# Patient Record
Sex: Female | Born: 1955 | Race: White | Hispanic: No | Marital: Single | Smoking: Former smoker
Health system: Southern US, Community
[De-identification: ages and names within clinical notes are randomized; demographics above are authoritative.]

## PROBLEM LIST (undated history)

## (undated) DIAGNOSIS — D649 Anemia, unspecified: Secondary | ICD-10-CM

## (undated) DIAGNOSIS — C801 Malignant (primary) neoplasm, unspecified: Secondary | ICD-10-CM

## (undated) HISTORY — PX: TUBAL LIGATION: SHX77

## (undated) HISTORY — PX: COLON SURGERY: SHX602

## (undated) HISTORY — PX: BREAST SURGERY: SHX581

## (undated) HISTORY — PX: OTHER SURGICAL HISTORY: SHX169

---

## 1997-05-04 ENCOUNTER — Ambulatory Visit (HOSPITAL_COMMUNITY): Admission: RE | Admit: 1997-05-04 | Discharge: 1997-05-04 | Payer: Self-pay

## 1997-05-06 ENCOUNTER — Ambulatory Visit (HOSPITAL_COMMUNITY): Admission: RE | Admit: 1997-05-06 | Discharge: 1997-05-06 | Payer: Self-pay

## 1997-11-08 ENCOUNTER — Ambulatory Visit (HOSPITAL_COMMUNITY): Admission: RE | Admit: 1997-11-08 | Discharge: 1997-11-08 | Payer: Self-pay

## 2002-02-19 HISTORY — PX: AUGMENTATION MAMMAPLASTY: SUR837

## 2002-10-28 ENCOUNTER — Ambulatory Visit (HOSPITAL_COMMUNITY): Admission: RE | Admit: 2002-10-28 | Discharge: 2002-10-28 | Payer: Self-pay | Admitting: Internal Medicine

## 2002-10-28 ENCOUNTER — Encounter: Payer: Self-pay | Admitting: Internal Medicine

## 2003-04-15 ENCOUNTER — Other Ambulatory Visit: Admission: RE | Admit: 2003-04-15 | Discharge: 2003-04-15 | Payer: Self-pay | Admitting: Internal Medicine

## 2007-04-29 DIAGNOSIS — D649 Anemia, unspecified: Secondary | ICD-10-CM | POA: Insufficient documentation

## 2007-04-29 DIAGNOSIS — N809 Endometriosis, unspecified: Secondary | ICD-10-CM | POA: Insufficient documentation

## 2008-04-20 ENCOUNTER — Encounter: Admission: RE | Admit: 2008-04-20 | Discharge: 2008-04-20 | Payer: Self-pay | Admitting: Obstetrics and Gynecology

## 2008-04-23 ENCOUNTER — Encounter: Admission: RE | Admit: 2008-04-23 | Discharge: 2008-04-23 | Payer: Self-pay | Admitting: Obstetrics and Gynecology

## 2009-01-24 ENCOUNTER — Inpatient Hospital Stay (HOSPITAL_COMMUNITY): Admission: AD | Admit: 2009-01-24 | Discharge: 2009-01-25 | Payer: Self-pay | Admitting: Gastroenterology

## 2009-01-25 ENCOUNTER — Inpatient Hospital Stay (HOSPITAL_COMMUNITY): Admission: AD | Admit: 2009-01-25 | Discharge: 2009-02-02 | Payer: Self-pay

## 2009-01-26 ENCOUNTER — Encounter (INDEPENDENT_AMBULATORY_CARE_PROVIDER_SITE_OTHER): Payer: Self-pay

## 2010-05-23 LAB — COMPREHENSIVE METABOLIC PANEL
Albumin: 3.5 g/dL (ref 3.5–5.2)
BUN: 7 mg/dL (ref 6–23)
Chloride: 108 mEq/L (ref 96–112)
Creatinine, Ser: 0.59 mg/dL (ref 0.4–1.2)
Total Bilirubin: 0.7 mg/dL (ref 0.3–1.2)
Total Protein: 5.6 g/dL — ABNORMAL LOW (ref 6.0–8.3)

## 2010-05-23 LAB — CBC
HCT: 25.2 % — ABNORMAL LOW (ref 36.0–46.0)
HCT: 27 % — ABNORMAL LOW (ref 36.0–46.0)
HCT: 31 % — ABNORMAL LOW (ref 36.0–46.0)
HCT: 40.3 % (ref 36.0–46.0)
Hemoglobin: 10.8 g/dL — ABNORMAL LOW (ref 12.0–15.0)
Hemoglobin: 8.8 g/dL — ABNORMAL LOW (ref 12.0–15.0)
MCHC: 34.6 g/dL (ref 30.0–36.0)
MCHC: 34.8 g/dL (ref 30.0–36.0)
MCV: 98.3 fL (ref 78.0–100.0)
MCV: 98.5 fL (ref 78.0–100.0)
MCV: 99.4 fL (ref 78.0–100.0)
MCV: 98 fL (ref 78.0–100.0)
Platelets: 235 10*3/uL (ref 150–400)
Platelets: 265 10*3/uL (ref 150–400)
RBC: 2.42 MIL/uL — ABNORMAL LOW (ref 3.87–5.11)
RBC: 3.16 MIL/uL — ABNORMAL LOW (ref 3.87–5.11)
RDW: 12.3 % (ref 11.5–15.5)
RDW: 12.4 % (ref 11.5–15.5)
RDW: 12.5 % (ref 11.5–15.5)
RDW: 12.1 % (ref 11.5–15.5)

## 2010-05-23 LAB — URINALYSIS, MICROSCOPIC ONLY
Bilirubin Urine: NEGATIVE
Hgb urine dipstick: NEGATIVE
Ketones, ur: NEGATIVE mg/dL
Protein, ur: NEGATIVE mg/dL
Urobilinogen, UA: 0.2 mg/dL (ref 0.0–1.0)

## 2010-05-23 LAB — BASIC METABOLIC PANEL
BUN: 2 mg/dL — ABNORMAL LOW (ref 6–23)
CO2: 28 mEq/L (ref 19–32)
Calcium: 8 mg/dL — ABNORMAL LOW (ref 8.4–10.5)
Chloride: 105 mEq/L (ref 96–112)
Chloride: 109 mEq/L (ref 96–112)
GFR calc Af Amer: >60 mL/min (ref >60)
Glucose, Bld: 133 mg/dL — ABNORMAL HIGH (ref 70–99)
Potassium: 3.6 mEq/L (ref 3.5–5.1)
Potassium: 3.6 mEq/L (ref 3.5–5.1)
Sodium: 137 mEq/L (ref 135–145)

## 2010-05-23 LAB — DIFFERENTIAL
Basophils Absolute: 0 10*3/uL (ref 0.0–0.1)
Lymphocytes Relative: 20 % (ref 12–46)
Monocytes Absolute: 0.4 10*3/uL (ref 0.1–1.0)
Neutro Abs: 5.5 10*3/uL (ref 1.7–7.7)

## 2010-05-23 LAB — HEMOGLOBIN AND HEMATOCRIT, BLOOD: Hemoglobin: 8.8 g/dL — ABNORMAL LOW (ref 12.0–15.0)

## 2010-05-23 LAB — TYPE AND SCREEN

## 2011-01-10 IMAGING — CR DG ABDOMEN ACUTE W/ 1V CHEST
3 series · 3 of 3 positions shown · non-contrast
Comparison: None.

CLINICAL DATA: Abdominal pain.  Nausea and vomiting.

ACUTE ABDOMEN SERIES (ABDOMEN 2 VIEW & CHEST 1 VIEW)

[w chest pa]
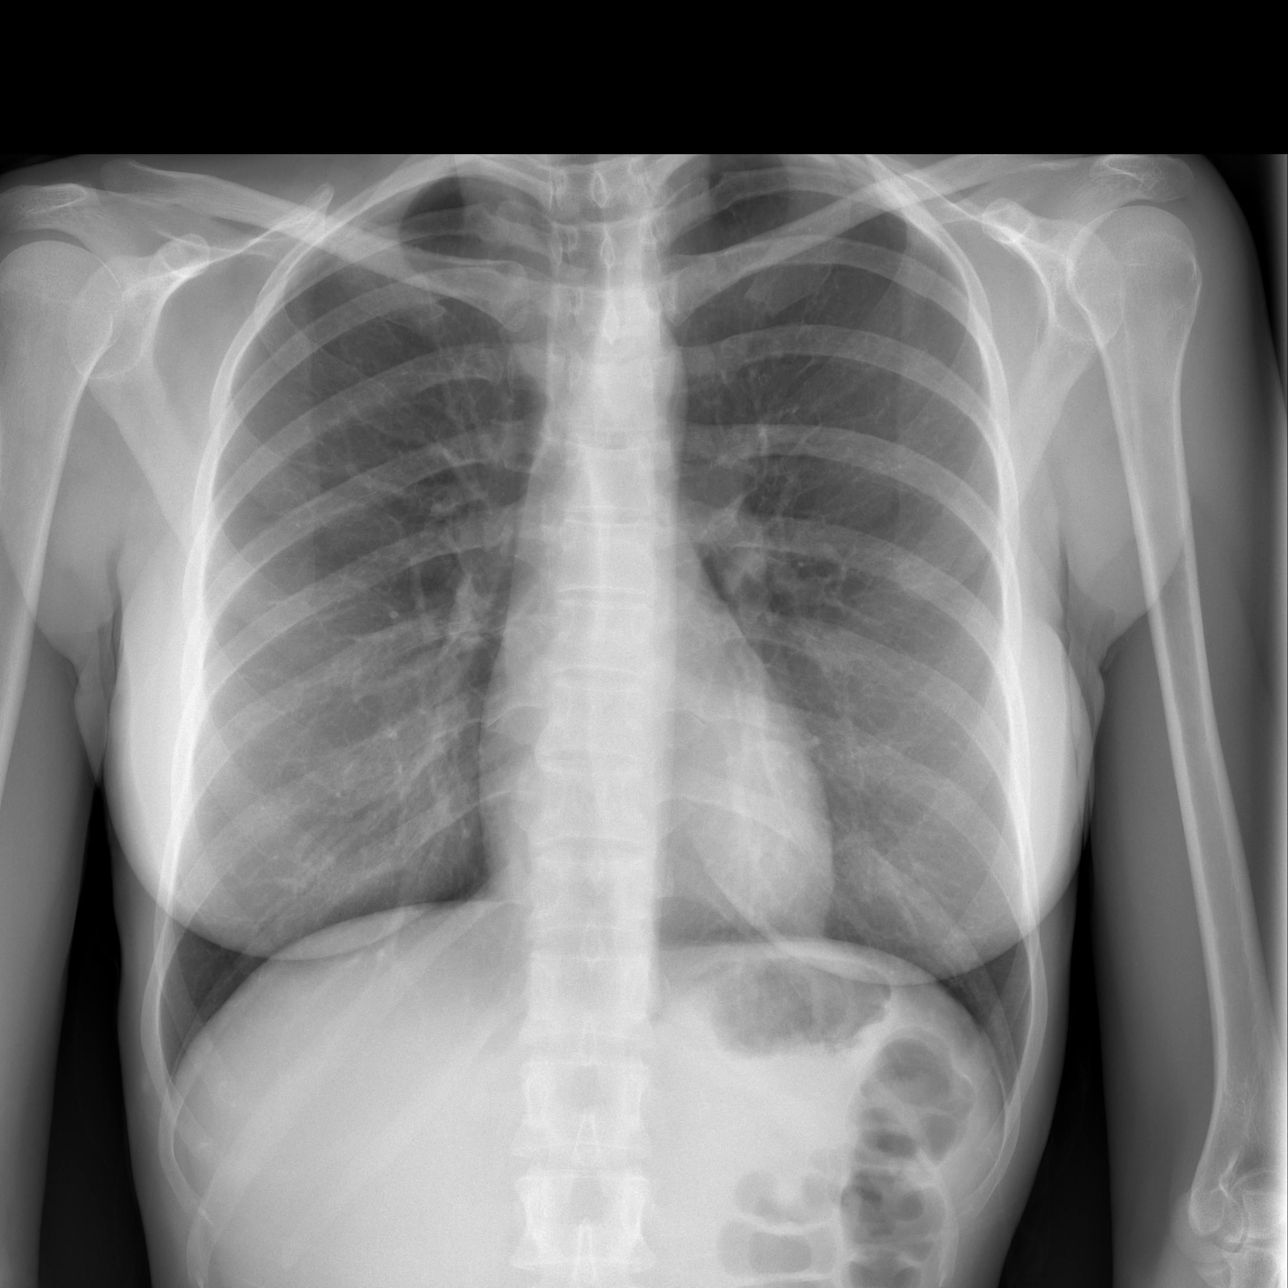

[w abdomen upright *]
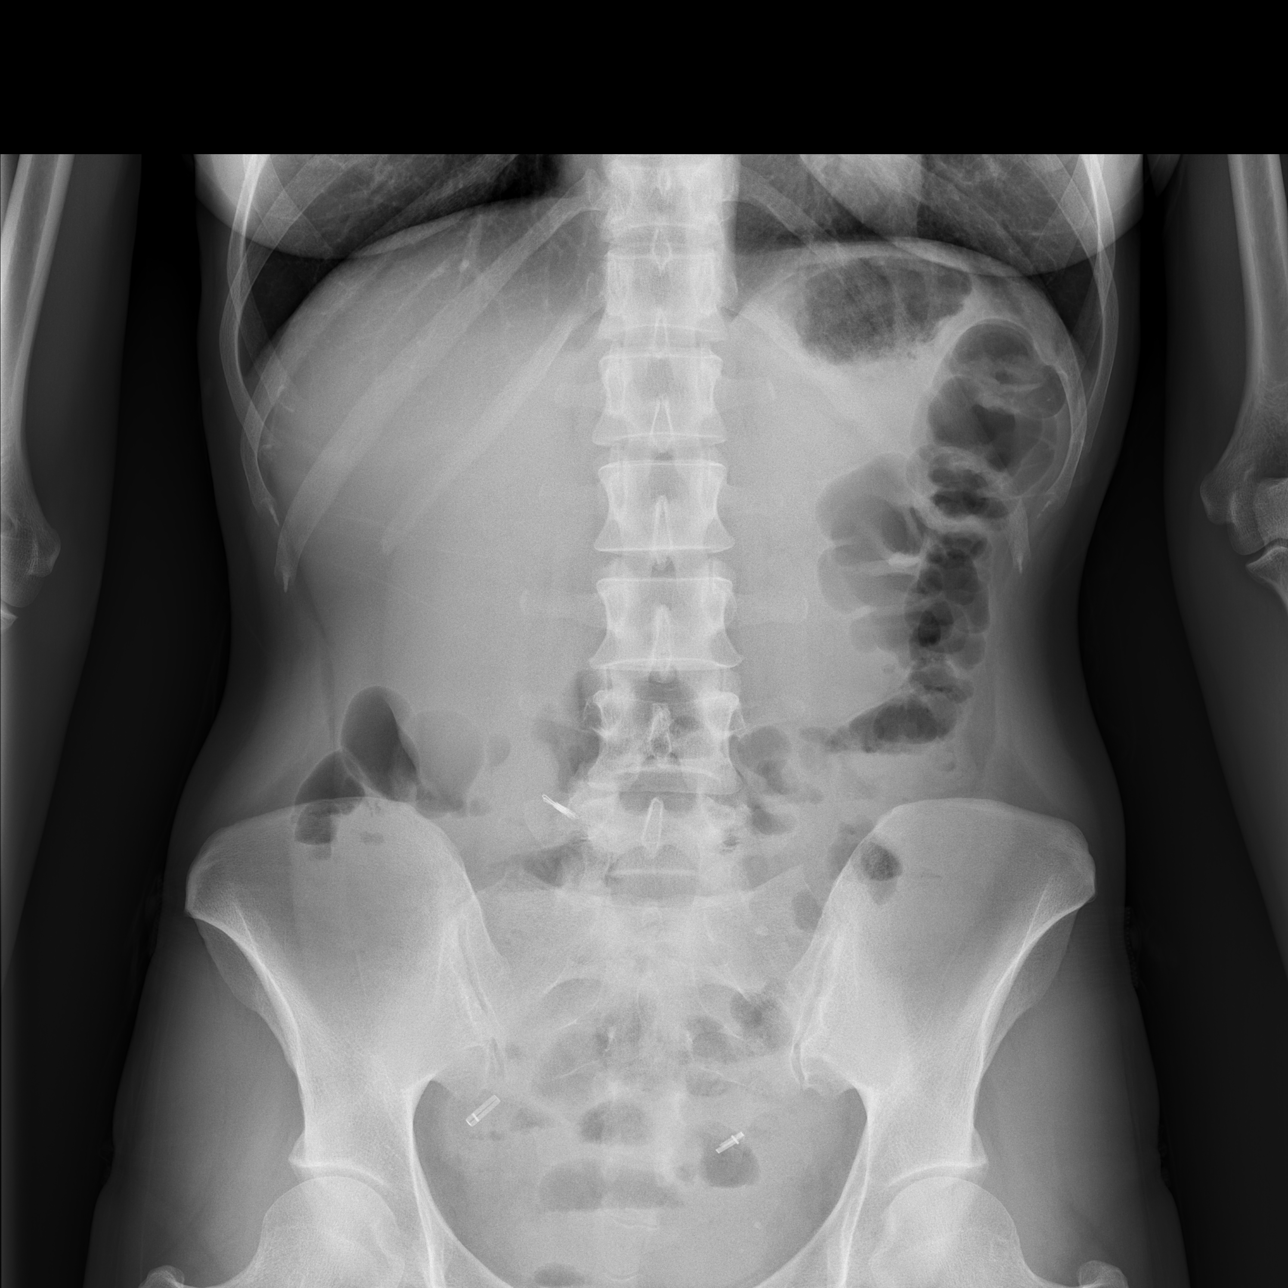

[t abdomen supine]
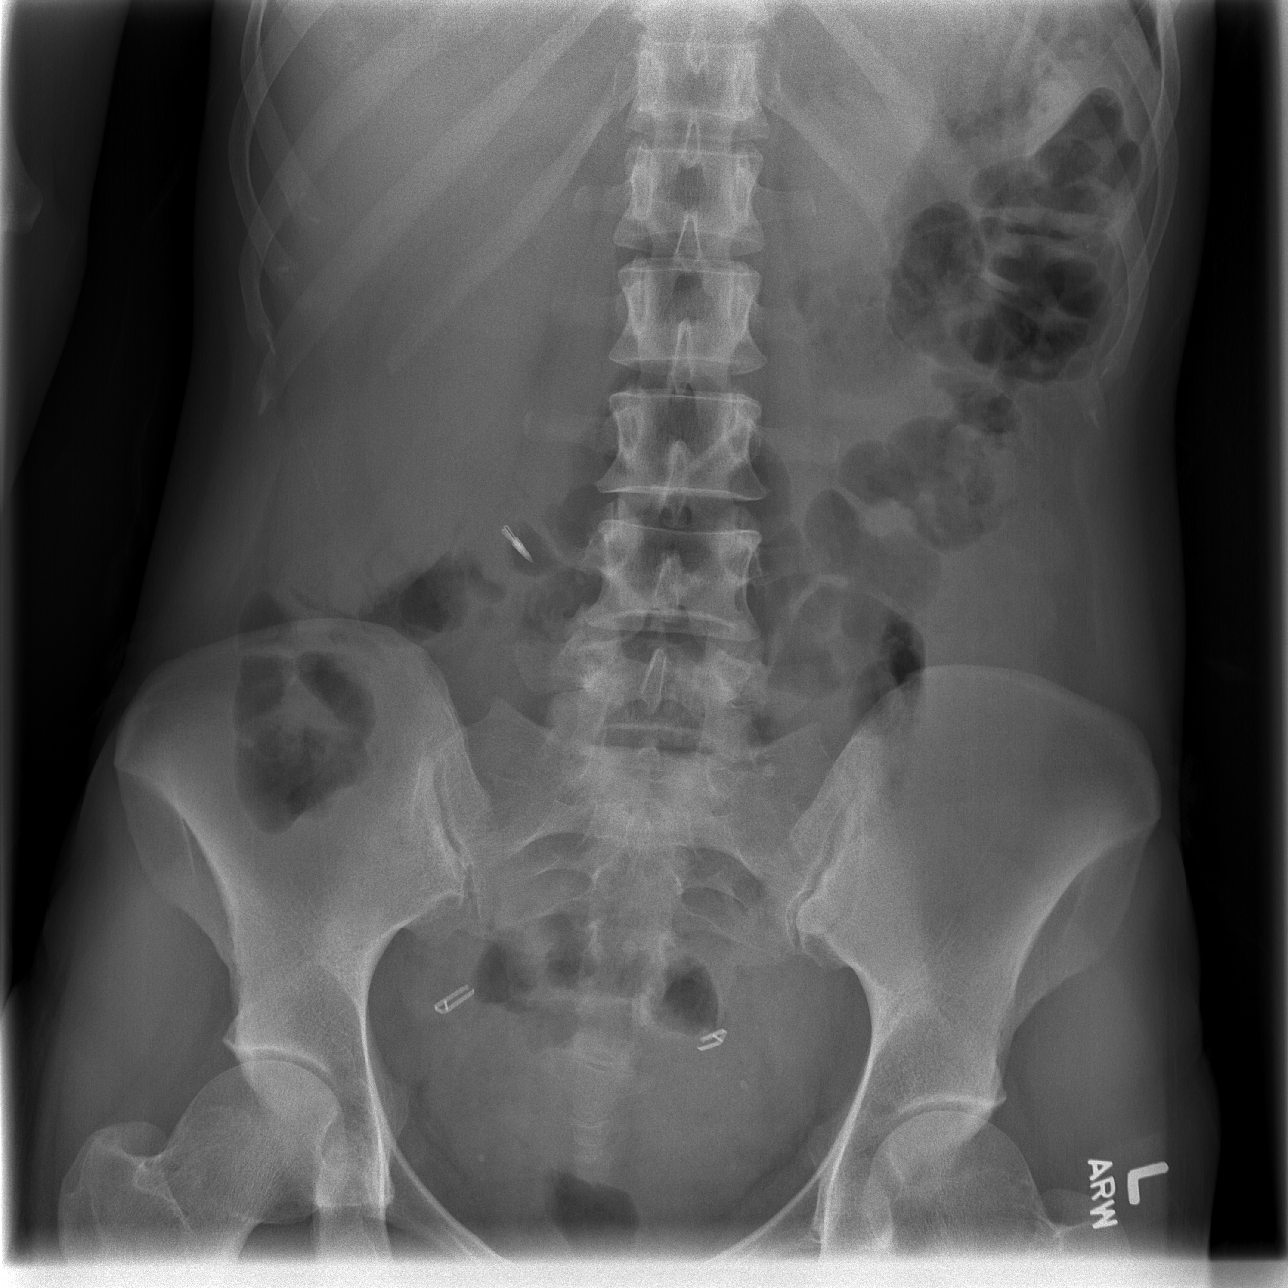

[3 of 3 positions shown; findings below may reference images not displayed]

FINDINGS: Heart size is normal.  The vascularity is normal and the
lungs are clear.

Surgical clip in the right mid abdomen may be related to
cholecystectomy.  There are bilateral fallopian clips.

Nonobstructive bowel gas pattern.  There is no free air.  There is
gas in nondilated colon.  The small bowel is not dilated.
IMPRESSION: No acute abnormality.

## 2012-07-15 ENCOUNTER — Other Ambulatory Visit (HOSPITAL_COMMUNITY): Payer: Self-pay | Admitting: Obstetrics and Gynecology

## 2012-07-15 DIAGNOSIS — Z1231 Encounter for screening mammogram for malignant neoplasm of breast: Secondary | ICD-10-CM

## 2012-07-23 ENCOUNTER — Ambulatory Visit (HOSPITAL_COMMUNITY)
Admission: RE | Admit: 2012-07-23 | Discharge: 2012-07-23 | Disposition: A | Discharge status: Home / Self Care | Payer: BC Managed Care – PPO | Source: Ambulatory Visit | Attending: Obstetrics and Gynecology | Admitting: Obstetrics and Gynecology

## 2012-07-23 DIAGNOSIS — Z1231 Encounter for screening mammogram for malignant neoplasm of breast: Secondary | ICD-10-CM

## 2012-08-01 ENCOUNTER — Other Ambulatory Visit (HOSPITAL_COMMUNITY)
Admission: RE | Admit: 2012-08-01 | Discharge: 2012-08-01 | Disposition: A | Discharge status: Home / Self Care | Payer: BC Managed Care – PPO | Source: Ambulatory Visit | Attending: Obstetrics and Gynecology | Admitting: Obstetrics and Gynecology

## 2012-08-01 ENCOUNTER — Other Ambulatory Visit: Payer: Self-pay | Admitting: Obstetrics and Gynecology

## 2012-08-01 DIAGNOSIS — Z1151 Encounter for screening for human papillomavirus (HPV): Secondary | ICD-10-CM | POA: Insufficient documentation

## 2012-08-01 DIAGNOSIS — Z01419 Encounter for gynecological examination (general) (routine) without abnormal findings: Secondary | ICD-10-CM | POA: Insufficient documentation

## 2014-06-22 ENCOUNTER — Other Ambulatory Visit: Payer: Self-pay | Admitting: Orthopedic Surgery

## 2014-06-25 ENCOUNTER — Encounter (HOSPITAL_BASED_OUTPATIENT_CLINIC_OR_DEPARTMENT_OTHER): Payer: Self-pay | Admitting: *Deleted

## 2014-06-30 ENCOUNTER — Encounter (HOSPITAL_BASED_OUTPATIENT_CLINIC_OR_DEPARTMENT_OTHER)
Admission: RE | Disposition: A | Discharge status: Home / Self Care | Payer: Self-pay | Source: Ambulatory Visit | Attending: Orthopedic Surgery

## 2014-06-30 ENCOUNTER — Ambulatory Visit (HOSPITAL_BASED_OUTPATIENT_CLINIC_OR_DEPARTMENT_OTHER)
Admission: RE | Admit: 2014-06-30 | Discharge: 2014-06-30 | Disposition: A | Discharge status: Home / Self Care | Payer: 59 | Source: Ambulatory Visit | Attending: Orthopedic Surgery | Admitting: Orthopedic Surgery

## 2014-06-30 DIAGNOSIS — Z86010 Personal history of colonic polyps: Secondary | ICD-10-CM | POA: Diagnosis not present

## 2014-06-30 DIAGNOSIS — D649 Anemia, unspecified: Secondary | ICD-10-CM | POA: Insufficient documentation

## 2014-06-30 DIAGNOSIS — Z87891 Personal history of nicotine dependence: Secondary | ICD-10-CM | POA: Insufficient documentation

## 2014-06-30 DIAGNOSIS — Z9851 Tubal ligation status: Secondary | ICD-10-CM | POA: Insufficient documentation

## 2014-06-30 DIAGNOSIS — M720 Palmar fascial fibromatosis [Dupuytren]: Secondary | ICD-10-CM | POA: Diagnosis not present

## 2014-06-30 HISTORY — PX: MASS EXCISION: SHX2000

## 2014-06-30 HISTORY — DX: Anemia, unspecified: D64.9

## 2014-06-30 HISTORY — DX: Malignant (primary) neoplasm, unspecified: C80.1

## 2014-06-30 SURGERY — MINOR EXCISION OF MASS
Anesthesia: LOCAL | Site: Hand | Laterality: Left

## 2014-06-30 MED ORDER — LIDOCAINE-EPINEPHRINE 1 %-1:100000 IJ SOLN
INTRAMUSCULAR | Status: DC | PRN
  Administered 2014-06-30: 24 mL

## 2014-06-30 MED ORDER — OXYCODONE-ACETAMINOPHEN 5-325 MG PO TABS: 1.0000 | ORAL_TABLET | ORAL | Status: DC | PRN

## 2014-06-30 MED ORDER — SODIUM BICARBONATE 4 % IV SOLN
INTRAVENOUS | Status: AC
  Filled 2014-06-30: qty 5

## 2014-06-30 MED ORDER — LIDOCAINE-EPINEPHRINE 1 %-1:100000 IJ SOLN
INTRAMUSCULAR | Status: AC
  Filled 2014-06-30: qty 1

## 2014-06-30 MED ORDER — SODIUM BICARBONATE 4 % IV SOLN
INTRAVENOUS | Status: DC | PRN
  Administered 2014-06-30: 5 mL via INTRAVENOUS

## 2014-06-30 MED ORDER — CHLORHEXIDINE GLUCONATE 4 % EX LIQD: 60.0000 mL | Freq: Once | CUTANEOUS | Status: DC

## 2014-06-30 SURGICAL SUPPLY — 47 items
APL SKNCLS STERI-STRIP NONHPOA (GAUZE/BANDAGES/DRESSINGS)
BANDAGE ELASTIC 3 VELCRO ST LF (GAUZE/BANDAGES/DRESSINGS) ×2 IMPLANT
BANDAGE ELASTIC 4 VELCRO ST LF (GAUZE/BANDAGES/DRESSINGS) IMPLANT
BENZOIN TINCTURE PRP APPL 2/3 (GAUZE/BANDAGES/DRESSINGS) IMPLANT
BLADE SURG 15 STRL LF DISP TIS (BLADE) ×1 IMPLANT
BLADE SURG 15 STRL SS (BLADE) ×2
BNDG COHESIVE 1X5 TAN STRL LF (GAUZE/BANDAGES/DRESSINGS) IMPLANT
BNDG GAUZE ELAST 4 BULKY (GAUZE/BANDAGES/DRESSINGS) ×2 IMPLANT
CORDS BIPOLAR (ELECTRODE) IMPLANT
COVER BACK TABLE 60X90IN (DRAPES) ×2 IMPLANT
DECANTER SPIKE VIAL GLASS SM (MISCELLANEOUS) IMPLANT
DRAPE EXTREMITY T 121X128X90 (DRAPE) ×2 IMPLANT
DRAPE SURG 17X23 STRL (DRAPES) IMPLANT
DURAPREP 26ML APPLICATOR (WOUND CARE) ×2 IMPLANT
GAUZE SPONGE 4X4 12PLY STRL (GAUZE/BANDAGES/DRESSINGS) ×2 IMPLANT
GAUZE XEROFORM 1X8 LF (GAUZE/BANDAGES/DRESSINGS) IMPLANT
GLOVE BIOGEL PI IND STRL 7.0 (GLOVE) ×1 IMPLANT
GLOVE BIOGEL PI INDICATOR 7.0 (GLOVE) ×1
GLOVE ECLIPSE 6.5 STRL STRAW (GLOVE) ×2 IMPLANT
GLOVE SURG SYN 8.0 (GLOVE) ×2 IMPLANT
GOWN STRL REUS W/ TWL LRG LVL3 (GOWN DISPOSABLE) ×1 IMPLANT
GOWN STRL REUS W/ TWL XL LVL3 (GOWN DISPOSABLE) IMPLANT
GOWN STRL REUS W/TWL LRG LVL3 (GOWN DISPOSABLE) ×2
GOWN STRL REUS W/TWL XL LVL3 (GOWN DISPOSABLE) ×2 IMPLANT
NDL SAFETY ECLIPSE 18X1.5 (NEEDLE) ×1 IMPLANT
NEEDLE HYPO 18GX1.5 SHARP (NEEDLE) ×2
NEEDLE HYPO 25X1 1.5 SAFETY (NEEDLE) IMPLANT
NEEDLE PRECISIONGLIDE 27X1.5 (NEEDLE) ×2 IMPLANT
NS IRRIG 1000ML POUR BTL (IV SOLUTION) ×2 IMPLANT
PACK BASIN DAY SURGERY FS (CUSTOM PROCEDURE TRAY) ×2 IMPLANT
PADDING CAST ABS 4INX4YD NS (CAST SUPPLIES)
PADDING CAST ABS COTTON 4X4 ST (CAST SUPPLIES) IMPLANT
SHEET MEDIUM DRAPE 40X70 STRL (DRAPES) ×2 IMPLANT
SPONGE GAUZE 4X4 12PLY STER LF (GAUZE/BANDAGES/DRESSINGS) IMPLANT
STOCKINETTE 4X48 STRL (DRAPES) ×2 IMPLANT
STRIP CLOSURE SKIN 1/2X4 (GAUZE/BANDAGES/DRESSINGS) IMPLANT
SUT ETHILON 4 0 PS 2 18 (SUTURE) ×2 IMPLANT
SUT ETHILON 5 0 PS 2 18 (SUTURE) IMPLANT
SUT PROLENE 3 0 PS 2 (SUTURE) IMPLANT
SUT VIC AB 4-0 P-3 18XBRD (SUTURE) IMPLANT
SUT VIC AB 4-0 P3 18 (SUTURE)
SUT VICRYL RAPIDE 4-0 (SUTURE) IMPLANT
SUT VICRYL RAPIDE 4/0 PS 2 (SUTURE) IMPLANT
SYR BULB 3OZ (MISCELLANEOUS) ×2 IMPLANT
SYRINGE 10CC LL (SYRINGE) ×4 IMPLANT
TOWEL OR 17X24 6PK STRL BLUE (TOWEL DISPOSABLE) ×2 IMPLANT
UNDERPAD 30X30 (UNDERPADS AND DIAPERS) ×2 IMPLANT

## 2014-06-30 NOTE — H&P (Signed)
Lindsey Owens is an 59 y.o. female.   Chief Complaint: painful left hand palmar masss HPI: as above with enlarging left hand palmar mass  Past Medical History  Diagnosis Date  . Anemia   . Cancer     polyp removed in colon    Past Surgical History  Procedure Laterality Date  . Tubal ligation    . Breast surgery      Breast implants - 10 years ago  . Uterine ablation    . Colon surgery      polyp removed from colon    History reviewed. No pertinent family history. Social History:  reports that she has quit smoking. She does not have any smokeless tobacco history on file. She reports that she drinks alcohol. She reports that she does not use illicit drugs.  Allergies: Not on File  No prescriptions prior to admission    No results found for this or any previous visit (from the past 48 hour(s)). No results found.  Review of Systems  All other systems reviewed and are negative.   Height 5\' 9"  (1.753 m), weight 50.349 kg (111 lb). Physical Exam  Constitutional: She is oriented to person, place, and time. She appears well-developed and well-nourished.  HENT:  Head: Normocephalic and atraumatic.  Cardiovascular: Normal rate.   Respiratory: Effort normal.  Musculoskeletal:       Left hand: She exhibits tenderness and deformity.  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  Skin: Skin is warm.  Psychiatric: She has a normal mood and affect. Her behavior is normal. Judgment and thought content normal.     Assessment/Plan As above   Plan excision of above  Lindsey Owens A 06/30/2014, 8:31 AM

## 2014-06-30 NOTE — Op Note (Signed)
See note [id number]

## 2014-07-01 NOTE — Op Note (Signed)
NAMECato Owens                ACCOUNT NO.:  [id number]  MEDICAL RECORD NO.:  [id number]  LOCATION:                                FACILITY:  [facility]  PHYSICIAN:  Sheral Apley. Koraima Albertsen, M.D.DATE OF BIRTH:  04/30/1955  DATE OF PROCEDURE:  06/30/2014 DATE OF DISCHARGE:  06/30/2014                              OPERATIVE REPORT   PREOPERATIVE DIAGNOSIS:  Dupuytren's nodule palmar aspect, left hand.  POSTOP:  Dupuytren's nodule palmar aspect, left hand.  PROCEDURE:  Excision of Dupuytren's nodule, left hand.  SURGEON:  Sheral Apley. Burney Gauze, M.D.  ASSISTANT:  None.  ANESTHESIA:  Local.  TOURNIQUET:  None.  COMPLICATIONS:  None.  DRAINS:  None.  SPECIMEN SENT:  None.  DESCRIPTION OF PROCEDURE:  Twenty-five minutes prior being taken to the operating suite, I injected 20 mL 1% lidocaine with epinephrine 1:100,000 and 2 mL of bicarb in and around the area around the ring metacarpal area, mid palmar area where a large Dupuytren's nodule was seen.  After 25 minutes, the patient was taken to the operating suite. Prepped and draped in usual sterile fashion.  Brunner incision was made over the left ring metacarpal, flaps were raised.  Dissection was carried down to the intersection between diseased and normal palmar fascia, carefully excised the nodule off the flexor sheath identifying the neurovascular bundles radially and ulnarly.  Small limited flexor synovectomy was performed.  Wound was irrigated and loosely closed with 4-0 nylon.  Xeroform, 4x4s, and compression wrap was applied.  The patient tolerated the procedure well and went to the recovery room in stable fashion.    Sheral Apley Burney Gauze, M.D.    MAW/MEDQ  D:  06/30/2014  T:  07/01/2014  Job:  [id number]

## 2014-07-02 ENCOUNTER — Encounter (HOSPITAL_BASED_OUTPATIENT_CLINIC_OR_DEPARTMENT_OTHER): Payer: Self-pay | Admitting: Orthopedic Surgery

## 2014-07-06 ENCOUNTER — Other Ambulatory Visit: Payer: Self-pay | Admitting: Orthopedic Surgery

## 2014-07-06 ENCOUNTER — Encounter (HOSPITAL_BASED_OUTPATIENT_CLINIC_OR_DEPARTMENT_OTHER): Payer: Self-pay | Admitting: *Deleted

## 2014-07-07 ENCOUNTER — Encounter (HOSPITAL_BASED_OUTPATIENT_CLINIC_OR_DEPARTMENT_OTHER)
Admission: RE | Disposition: A | Discharge status: Home / Self Care | Payer: Self-pay | Source: Ambulatory Visit | Attending: Orthopedic Surgery

## 2014-07-07 ENCOUNTER — Ambulatory Visit (HOSPITAL_BASED_OUTPATIENT_CLINIC_OR_DEPARTMENT_OTHER)
Admission: RE | Admit: 2014-07-07 | Discharge: 2014-07-07 | Disposition: A | Discharge status: Home / Self Care | Payer: 59 | Source: Ambulatory Visit | Attending: Orthopedic Surgery | Admitting: Orthopedic Surgery

## 2014-07-07 ENCOUNTER — Encounter (HOSPITAL_BASED_OUTPATIENT_CLINIC_OR_DEPARTMENT_OTHER): Payer: Self-pay

## 2014-07-07 DIAGNOSIS — Z9851 Tubal ligation status: Secondary | ICD-10-CM | POA: Diagnosis not present

## 2014-07-07 DIAGNOSIS — Z86010 Personal history of colonic polyps: Secondary | ICD-10-CM | POA: Insufficient documentation

## 2014-07-07 DIAGNOSIS — Z87891 Personal history of nicotine dependence: Secondary | ICD-10-CM | POA: Insufficient documentation

## 2014-07-07 DIAGNOSIS — D649 Anemia, unspecified: Secondary | ICD-10-CM | POA: Diagnosis not present

## 2014-07-07 DIAGNOSIS — M720 Palmar fascial fibromatosis [Dupuytren]: Secondary | ICD-10-CM | POA: Insufficient documentation

## 2014-07-07 HISTORY — PX: MASS EXCISION: SHX2000

## 2014-07-07 SURGERY — MINOR EXCISION OF MASS
Anesthesia: LOCAL | Site: Hand | Laterality: Right

## 2014-07-07 MED ORDER — SODIUM BICARBONATE 4 % IV SOLN
INTRAVENOUS | Status: AC
  Filled 2014-07-07: qty 5

## 2014-07-07 MED ORDER — SODIUM BICARBONATE 4 % IV SOLN
INTRAVENOUS | Status: DC | PRN
  Administered 2014-07-07: 5 mL via INTRAVENOUS

## 2014-07-07 MED ORDER — LIDOCAINE-EPINEPHRINE 1 %-1:100000 IJ SOLN
INTRAMUSCULAR | Status: DC | PRN
  Administered 2014-07-07: 20 mL

## 2014-07-07 MED ORDER — LIDOCAINE-EPINEPHRINE 1 %-1:100000 IJ SOLN
INTRAMUSCULAR | Status: AC
  Filled 2014-07-07: qty 1

## 2014-07-07 MED ORDER — CHLORHEXIDINE GLUCONATE 4 % EX LIQD: 60.0000 mL | Freq: Once | CUTANEOUS | Status: DC

## 2014-07-07 SURGICAL SUPPLY — 46 items
APL SKNCLS STERI-STRIP NONHPOA (GAUZE/BANDAGES/DRESSINGS)
BANDAGE ELASTIC 4 VELCRO ST LF (GAUZE/BANDAGES/DRESSINGS) IMPLANT
BENZOIN TINCTURE PRP APPL 2/3 (GAUZE/BANDAGES/DRESSINGS) IMPLANT
BLADE SURG 15 STRL LF DISP TIS (BLADE) ×1 IMPLANT
BLADE SURG 15 STRL SS (BLADE) ×2
BNDG COHESIVE 1X5 TAN STRL LF (GAUZE/BANDAGES/DRESSINGS) IMPLANT
BNDG GAUZE ELAST 4 BULKY (GAUZE/BANDAGES/DRESSINGS) ×2 IMPLANT
CORDS BIPOLAR (ELECTRODE) ×2 IMPLANT
COVER BACK TABLE 60X90IN (DRAPES) ×2 IMPLANT
DECANTER SPIKE VIAL GLASS SM (MISCELLANEOUS) IMPLANT
DRAPE EXTREMITY T 121X128X90 (DRAPE) ×2 IMPLANT
DRAPE SURG 17X23 STRL (DRAPES) ×2 IMPLANT
DURAPREP 26ML APPLICATOR (WOUND CARE) ×2 IMPLANT
GAUZE SPONGE 4X4 12PLY STRL (GAUZE/BANDAGES/DRESSINGS) ×2 IMPLANT
GAUZE XEROFORM 1X8 LF (GAUZE/BANDAGES/DRESSINGS) ×2 IMPLANT
GLOVE BIOGEL PI IND STRL 7.0 (GLOVE) ×1 IMPLANT
GLOVE BIOGEL PI INDICATOR 7.0 (GLOVE) ×1
GLOVE ECLIPSE 6.5 STRL STRAW (GLOVE) ×2 IMPLANT
GLOVE SURG SYN 8.0 (GLOVE) ×2 IMPLANT
GOWN STRL REUS W/ TWL LRG LVL3 (GOWN DISPOSABLE) ×1 IMPLANT
GOWN STRL REUS W/ TWL XL LVL3 (GOWN DISPOSABLE) ×1 IMPLANT
GOWN STRL REUS W/TWL LRG LVL3 (GOWN DISPOSABLE) ×2
GOWN STRL REUS W/TWL XL LVL3 (GOWN DISPOSABLE) ×4 IMPLANT
NDL SAFETY ECLIPSE 18X1.5 (NEEDLE) ×1 IMPLANT
NEEDLE HYPO 18GX1.5 SHARP (NEEDLE) ×2
NEEDLE HYPO 25X1 1.5 SAFETY (NEEDLE) ×2 IMPLANT
NEEDLE PRECISIONGLIDE 27X1.5 (NEEDLE) ×2 IMPLANT
NS IRRIG 1000ML POUR BTL (IV SOLUTION) ×2 IMPLANT
PACK BASIN DAY SURGERY FS (CUSTOM PROCEDURE TRAY) ×2 IMPLANT
PADDING CAST ABS 4INX4YD NS (CAST SUPPLIES)
PADDING CAST ABS COTTON 4X4 ST (CAST SUPPLIES) IMPLANT
SHEET MEDIUM DRAPE 40X70 STRL (DRAPES) ×2 IMPLANT
SPONGE GAUZE 4X4 12PLY STER LF (GAUZE/BANDAGES/DRESSINGS) ×2 IMPLANT
STOCKINETTE 4X48 STRL (DRAPES) ×2 IMPLANT
STRIP CLOSURE SKIN 1/2X4 (GAUZE/BANDAGES/DRESSINGS) IMPLANT
SUT ETHILON 4 0 PS 2 18 (SUTURE) ×2 IMPLANT
SUT ETHILON 5 0 PS 2 18 (SUTURE) IMPLANT
SUT PROLENE 3 0 PS 2 (SUTURE) IMPLANT
SUT VIC AB 4-0 P-3 18XBRD (SUTURE) IMPLANT
SUT VIC AB 4-0 P3 18 (SUTURE)
SUT VICRYL RAPIDE 4-0 (SUTURE) IMPLANT
SUT VICRYL RAPIDE 4/0 PS 2 (SUTURE) IMPLANT
SYR BULB 3OZ (MISCELLANEOUS) ×2 IMPLANT
SYRINGE 10CC LL (SYRINGE) ×4 IMPLANT
TOWEL OR 17X24 6PK STRL BLUE (TOWEL DISPOSABLE) ×2 IMPLANT
UNDERPAD 30X30 (UNDERPADS AND DIAPERS) ×2 IMPLANT

## 2014-07-07 NOTE — Op Note (Signed)
See note [id number]

## 2014-07-07 NOTE — H&P (Signed)
Lindsey Owens is an 59 y.o. female.   Chief Complaint: right hand painful palmar mass HPI: as above with h/o DC involving palms with painful nodules  Past Medical History  Diagnosis Date  . Anemia   . Cancer     polyp removed in colon    Past Surgical History  Procedure Laterality Date  . Tubal ligation    . Breast surgery      Breast implants - 10 years ago  . Uterine ablation    . Colon surgery      polyp removed from colon  . Mass excision Left 06/30/2014    Procedure: MINOR EXCISION OF DUPUYTREN NODULE LEFT PALM;  Surgeon: Charlotte Crumb, MD;  Location: [facility];  Service: Orthopedics;  Laterality: Left;    History reviewed. No pertinent family history. Social History:  reports that she has quit smoking. She does not have any smokeless tobacco history on file. She reports that she drinks alcohol. She reports that she does not use illicit drugs.  Allergies: No Known Allergies  No prescriptions prior to admission    No results found for this or any previous visit (from the past 48 hour(s)). No results found.  Review of Systems  All other systems reviewed and are negative.   There were no vitals taken for this visit. Physical Exam  Constitutional: She is oriented to person, place, and time. She appears well-developed and well-nourished.  HENT:  Head: Normocephalic and atraumatic.  Cardiovascular: Normal rate.   Respiratory: Effort normal.  Musculoskeletal:       Right hand: She exhibits tenderness and swelling.  Right hand painful dupuytrens contracture nodule  Neurological: She is alert and oriented to person, place, and time.  Skin: Skin is warm.  Psychiatric: She has a normal mood and affect. Her behavior is normal. Judgment and thought content normal.     Assessment/Plan As above  Plan excision of right hand nodule  Cyler Kappes A 07/07/2014, 10:37 AM

## 2014-07-08 ENCOUNTER — Encounter (HOSPITAL_BASED_OUTPATIENT_CLINIC_OR_DEPARTMENT_OTHER): Payer: Self-pay | Admitting: Orthopedic Surgery

## 2014-07-08 NOTE — Op Note (Signed)
NAME:  Lindsey Owens, Lindsey Owens                ACCOUNT NO.:  [id number]  MEDICAL RECORD NO.:  [id number]  LOCATION:                                FACILITY:  [facility]  PHYSICIAN:  Sheral Apley. Delila Kuklinski, M.D.DATE OF BIRTH:  [birth year]  DATE OF PROCEDURE:  07/07/2014 DATE OF DISCHARGE:  07/07/2014                              OPERATIVE REPORT   PREOPERATIVE DIAGNOSIS:  Painful palmar nodule, right hand, consistent with Dupuytren contracture.  POSTOPERATIVE DIAGNOSIS:  Painful palmar nodule, right hand, consistent with Dupuytren contracture.  PROCEDURE:  Excision of Dupuytren contracture, nodule, palmar aspect of right hand.  SURGEON:  Sheral Apley. Burney Gauze, M.D.  ASSISTANT:  None.  ANESTHESIA:  Local.  TOURNIQUET:  None.  COMPLICATIONS:  None.  DRAINS:  None.  SPECIMEN:  One specimen.  DESCRIPTION OF PROCEDURE:  25 minutes prior to being taken to the operating suite, I injected 20 mL of 1% lidocaine with epinephrine 1:100,000 and 1 mL of bicarb solution in and around the premarked incision over a palpable painful nodule over the ring metacarpal area, consistent with Dupuytren contracture.  After 25 minutes, the patient was taken into the operating suite, prepped and draped in usual sterile fashion.  Brunner incision was made.  Dissection was carried down to the intersection between the disease and normal palmar fascia proximally, carefully incised this and excised the Dupuytren nodule with care to identify and retract the neurovascular structures.  We excised the softer flexor sheath.  This was sent for pathologic confirmation.  Wound was irrigated and loosely closed with 4-0 nylon.  Xeroform, 4x4s, and compressive wrap were applied.  The patient tolerated the procedure well and went to the recovery room in stable fashion.    Sheral Apley Burney Gauze, M.D.    MAW/MEDQ  D:  07/07/2014  T:  07/08/2014  Job:  [id number]

## 2014-07-21 HISTORY — PX: HAND SURGERY: SHX662

## 2014-08-12 ENCOUNTER — Other Ambulatory Visit: Payer: Self-pay | Admitting: Gastroenterology

## 2014-09-29 ENCOUNTER — Encounter (HOSPITAL_COMMUNITY): Payer: Self-pay | Admitting: *Deleted

## 2014-10-05 ENCOUNTER — Encounter (HOSPITAL_COMMUNITY): Payer: Self-pay

## 2014-10-05 ENCOUNTER — Ambulatory Visit (HOSPITAL_COMMUNITY): Payer: 59 | Admitting: Anesthesiology

## 2014-10-05 ENCOUNTER — Ambulatory Visit (HOSPITAL_COMMUNITY)
Admission: RE | Admit: 2014-10-05 | Discharge: 2014-10-05 | Disposition: A | Discharge status: Home / Self Care | Payer: 59 | Source: Ambulatory Visit | Attending: Gastroenterology | Admitting: Gastroenterology

## 2014-10-05 ENCOUNTER — Encounter (HOSPITAL_COMMUNITY)
Admission: RE | Disposition: A | Discharge status: Home / Self Care | Payer: Self-pay | Source: Ambulatory Visit | Attending: Gastroenterology

## 2014-10-05 DIAGNOSIS — Z800 Family history of malignant neoplasm of digestive organs: Secondary | ICD-10-CM | POA: Diagnosis not present

## 2014-10-05 DIAGNOSIS — Z86010 Personal history of colonic polyps: Secondary | ICD-10-CM | POA: Diagnosis not present

## 2014-10-05 DIAGNOSIS — Z87891 Personal history of nicotine dependence: Secondary | ICD-10-CM | POA: Insufficient documentation

## 2014-10-05 DIAGNOSIS — Z1211 Encounter for screening for malignant neoplasm of colon: Secondary | ICD-10-CM | POA: Insufficient documentation

## 2014-10-05 HISTORY — PX: COLONOSCOPY WITH PROPOFOL: SHX5780

## 2014-10-05 SURGERY — COLONOSCOPY WITH PROPOFOL
Anesthesia: Monitor Anesthesia Care

## 2014-10-05 MED ORDER — LIDOCAINE HCL (CARDIAC) 20 MG/ML IV SOLN
INTRAVENOUS | Status: AC
  Filled 2014-10-05: qty 5

## 2014-10-05 MED ORDER — LACTATED RINGERS IV SOLN
INTRAVENOUS | Status: DC
  Administered 2014-10-05: 1000 mL via INTRAVENOUS
  Administered 2014-10-05: 13:00:00 via INTRAVENOUS

## 2014-10-05 MED ORDER — PROPOFOL 10 MG/ML IV BOLUS
INTRAVENOUS | Status: AC
  Filled 2014-10-05: qty 20

## 2014-10-05 MED ORDER — PROPOFOL 500 MG/50ML IV EMUL
INTRAVENOUS | Status: DC | PRN
  Administered 2014-10-05 (×2): 10 mg via INTRAVENOUS
  Administered 2014-10-05: 60 mg via INTRAVENOUS

## 2014-10-05 MED ORDER — SODIUM CHLORIDE 0.9 % IV SOLN: INTRAVENOUS | Status: DC

## 2014-10-05 MED ORDER — PROPOFOL INFUSION 10 MG/ML OPTIME
INTRAVENOUS | Status: DC | PRN
  Administered 2014-10-05: 140 ug/kg/min via INTRAVENOUS

## 2014-10-05 SURGICAL SUPPLY — 21 items

## 2014-10-05 NOTE — Transfer of Care (Signed)
Immediate Anesthesia Transfer of Care Note  Patient: Lindsey Owens  Procedure(s) Performed: Procedure(s): COLONOSCOPY WITH PROPOFOL (N/A)  Patient Location: PACU  Anesthesia Type:MAC  Level of Consciousness: awake, alert  and oriented  Airway & Oxygen Therapy: Patient Spontanous Breathing and Patient connected to face mask oxygen  Post-op Assessment: Report given to RN and Post -op Vital signs reviewed and stable  Post vital signs: Reviewed and stable  Last Vitals:  Filed Vitals:   10/05/14 1232  BP: 110/65  Pulse: 61  Temp: 36.7 C  Resp: 16    Complications: No apparent anesthesia complications

## 2014-10-05 NOTE — Anesthesia Postprocedure Evaluation (Signed)
  Anesthesia Post-op Note  Patient: Lindsey Owens  Procedure(s) Performed: Procedure(s) (LRB): COLONOSCOPY WITH PROPOFOL (N/A)  Patient Location: PACU  Anesthesia Type: MAC  Level of Consciousness: awake and alert   Airway and Oxygen Therapy: Patient Spontanous Breathing  Post-op Pain: mild  Post-op Assessment: Post-op Vital signs reviewed, Patient's Cardiovascular Status Stable, Respiratory Function Stable, Patent Airway and No signs of Nausea or vomiting  Last Vitals:  Filed Vitals:   10/05/14 1420  BP: 125/69  Pulse: 67  Temp:   Resp: 17    Post-op Vital Signs: stable   Complications: No apparent anesthesia complications

## 2014-10-05 NOTE — Anesthesia Preprocedure Evaluation (Signed)
Anesthesia Evaluation  Patient identified by MRN, date of birth, ID band Patient awake    Reviewed: Allergy & Precautions, NPO status , Patient's Chart, lab work & pertinent test results  Airway Mallampati: II  TM Distance: >3 FB Neck ROM: Full    Dental no notable dental hx.    Pulmonary former smoker,  breath sounds clear to auscultation  Pulmonary exam normal       Cardiovascular negative cardio ROS Normal cardiovascular examRhythm:Regular Rate:Normal     Neuro/Psych negative neurological ROS  negative psych ROS   GI/Hepatic negative GI ROS, Neg liver ROS,   Endo/Other  negative endocrine ROS  Renal/GU negative Renal ROS  negative genitourinary   Musculoskeletal negative musculoskeletal ROS (+)   Abdominal   Peds negative pediatric ROS (+)  Hematology negative hematology ROS (+)   Anesthesia Other Findings   Reproductive/Obstetrics negative OB ROS                             Anesthesia Physical Anesthesia Plan  ASA: II  Anesthesia Plan: MAC   Post-op Pain Management:    Induction:   Airway Management Planned: Simple Face Mask  Additional Equipment:   Intra-op Plan:   Post-operative Plan:   Informed Consent: I have reviewed the patients History and Physical, chart, labs and discussed the procedure including the risks, benefits and alternatives for the proposed anesthesia with the patient or authorized representative who has indicated his/her understanding and acceptance.   Dental advisory given  Plan Discussed with: CRNA  Anesthesia Plan Comments:         Anesthesia Quick Evaluation

## 2014-10-05 NOTE — Op Note (Signed)
Procedure: Surveillance colonoscopy. 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 Sister  diagnosed with colon cancer under age 57.  Endoscopist: Earle Gell  Premedication: Propofol administered by anesthesia  Procedure: The patient was placed in the left lateral decubitus position. Anal inspection and digital rectal exam were normal. The Pentax pediatric colonoscope was introduced into the rectum and advanced to the ileo-right colonic surgical anastomosis. Colonic preparation for the exam today was good. Withdrawal time was 10 minutes  Rectum. Normal. Retroflex view of the distal rectum was normal  Sigmoid colon and descending colon. Normal  Splenic flexure. Normal  Transverse colon. Normal  Hepatic flexure. Normal  Ileo-right colonic surgical anastomosis. Normal  Assessment: Normal surveillance colonoscopy post segmental sigmoid colon resection to remove a tubulovillous adenomatous polyp from the ascending colon in  2010.  Recommendation: Repeat surveillance colonoscopy in 5 years

## 2014-10-05 NOTE — H&P (Signed)
  Procedure: Surveillance colonoscopy. Tubulovillous adenomatous polyp removed surgically from the ascending colon in 2010. Sister diagnosed with colon cancer under age 56 at [facility]  History: The patient is a 59 year old female born 03-17-1955. She is scheduled to undergo a surveillance colonoscopy today.  Past medical history: Anxiety. Endometriosis. Shingles diagnosed in March 2016. Benign right breast biopsy. Breast implants. Appendectomy. Segmental right colon resection to remove a tubulovillous ascending colon polyp  Exam: The patient is alert and lying comfortably on the endoscopy stretcher. Abdomen is soft and nontender to palpation. Lungs are clear to auscultation. Cardiac exam reveals a regular rhythm.  Plan: Proceed with surveillance colonoscopy

## 2014-10-05 NOTE — Discharge Instructions (Signed)

## 2014-10-06 ENCOUNTER — Encounter (HOSPITAL_COMMUNITY): Payer: Self-pay | Admitting: Gastroenterology

## 2014-12-06 ENCOUNTER — Other Ambulatory Visit: Payer: Self-pay

## 2014-12-06 DIAGNOSIS — Z1231 Encounter for screening mammogram for malignant neoplasm of breast: Secondary | ICD-10-CM

## 2014-12-20 ENCOUNTER — Ambulatory Visit
Admission: RE | Admit: 2014-12-20 | Discharge: 2014-12-20 | Disposition: A | Discharge status: Home / Self Care | Payer: 59 | Source: Ambulatory Visit

## 2014-12-20 DIAGNOSIS — Z1231 Encounter for screening mammogram for malignant neoplasm of breast: Secondary | ICD-10-CM

## 2014-12-23 ENCOUNTER — Other Ambulatory Visit: Payer: Self-pay | Admitting: Internal Medicine

## 2014-12-23 DIAGNOSIS — R928 Other abnormal and inconclusive findings on diagnostic imaging of breast: Secondary | ICD-10-CM

## 2015-01-03 ENCOUNTER — Other Ambulatory Visit: Payer: 59

## 2015-01-10 ENCOUNTER — Ambulatory Visit
Admission: RE | Admit: 2015-01-10 | Discharge: 2015-01-10 | Disposition: A | Discharge status: Home / Self Care | Payer: 59 | Source: Ambulatory Visit | Attending: Internal Medicine | Admitting: Internal Medicine

## 2015-01-10 DIAGNOSIS — R928 Other abnormal and inconclusive findings on diagnostic imaging of breast: Secondary | ICD-10-CM

## 2015-01-28 ENCOUNTER — Other Ambulatory Visit: Payer: Self-pay | Admitting: Orthopedic Surgery

## 2015-02-08 ENCOUNTER — Other Ambulatory Visit (HOSPITAL_COMMUNITY)
Admission: RE | Admit: 2015-02-08 | Discharge: 2015-02-08 | Disposition: A | Discharge status: Home / Self Care | Payer: 59 | Source: Ambulatory Visit | Attending: Obstetrics and Gynecology | Admitting: Obstetrics and Gynecology

## 2015-02-08 ENCOUNTER — Other Ambulatory Visit: Payer: Self-pay | Admitting: Obstetrics and Gynecology

## 2015-02-08 DIAGNOSIS — Z01419 Encounter for gynecological examination (general) (routine) without abnormal findings: Secondary | ICD-10-CM | POA: Diagnosis present

## 2015-02-09 LAB — CYTOLOGY - PAP

## 2015-12-28 ENCOUNTER — Other Ambulatory Visit: Payer: Self-pay | Admitting: Internal Medicine

## 2015-12-28 DIAGNOSIS — Z1231 Encounter for screening mammogram for malignant neoplasm of breast: Secondary | ICD-10-CM

## 2016-01-31 ENCOUNTER — Ambulatory Visit
Admission: RE | Admit: 2016-01-31 | Discharge: 2016-01-31 | Disposition: A | Discharge status: Home / Self Care | Payer: BLUE CROSS/BLUE SHIELD | Source: Ambulatory Visit | Attending: Internal Medicine | Admitting: Internal Medicine

## 2016-01-31 DIAGNOSIS — Z1231 Encounter for screening mammogram for malignant neoplasm of breast: Secondary | ICD-10-CM

## 2016-02-09 ENCOUNTER — Other Ambulatory Visit (HOSPITAL_COMMUNITY)
Admission: RE | Admit: 2016-02-09 | Discharge: 2016-02-09 | Disposition: A | Discharge status: Home / Self Care | Payer: BLUE CROSS/BLUE SHIELD | Source: Ambulatory Visit | Attending: Obstetrics and Gynecology | Admitting: Obstetrics and Gynecology

## 2016-02-09 ENCOUNTER — Other Ambulatory Visit: Payer: Self-pay | Admitting: Obstetrics and Gynecology

## 2016-02-09 DIAGNOSIS — Z01419 Encounter for gynecological examination (general) (routine) without abnormal findings: Secondary | ICD-10-CM | POA: Insufficient documentation

## 2016-02-09 DIAGNOSIS — Z1151 Encounter for screening for human papillomavirus (HPV): Secondary | ICD-10-CM | POA: Insufficient documentation

## 2016-02-14 LAB — CYTOLOGY - PAP
Diagnosis: NEGATIVE
HPV: NOT DETECTED

## 2016-08-16 DIAGNOSIS — D508 Other iron deficiency anemias: Secondary | ICD-10-CM | POA: Insufficient documentation

## 2016-08-16 DIAGNOSIS — R7303 Prediabetes: Secondary | ICD-10-CM | POA: Insufficient documentation

## 2016-08-16 DIAGNOSIS — R462 Strange and inexplicable behavior: Secondary | ICD-10-CM | POA: Insufficient documentation

## 2016-12-26 IMAGING — MG MM DIAG BREAST TOMO UNI LEFT
4 series · 4 of 12 positions shown · non-contrast
Comparison: Previous exam(s).

CLINICAL DATA: Left lateral breast distortion seen on most recent
screening mammography.

EXAM:
DIGITAL DIAGNOSTIC LEFT MAMMOGRAM WITH 3D TOMOSYNTHESIS WITH CAD
ULTRASOUND LEFT BREAST

[L CC]
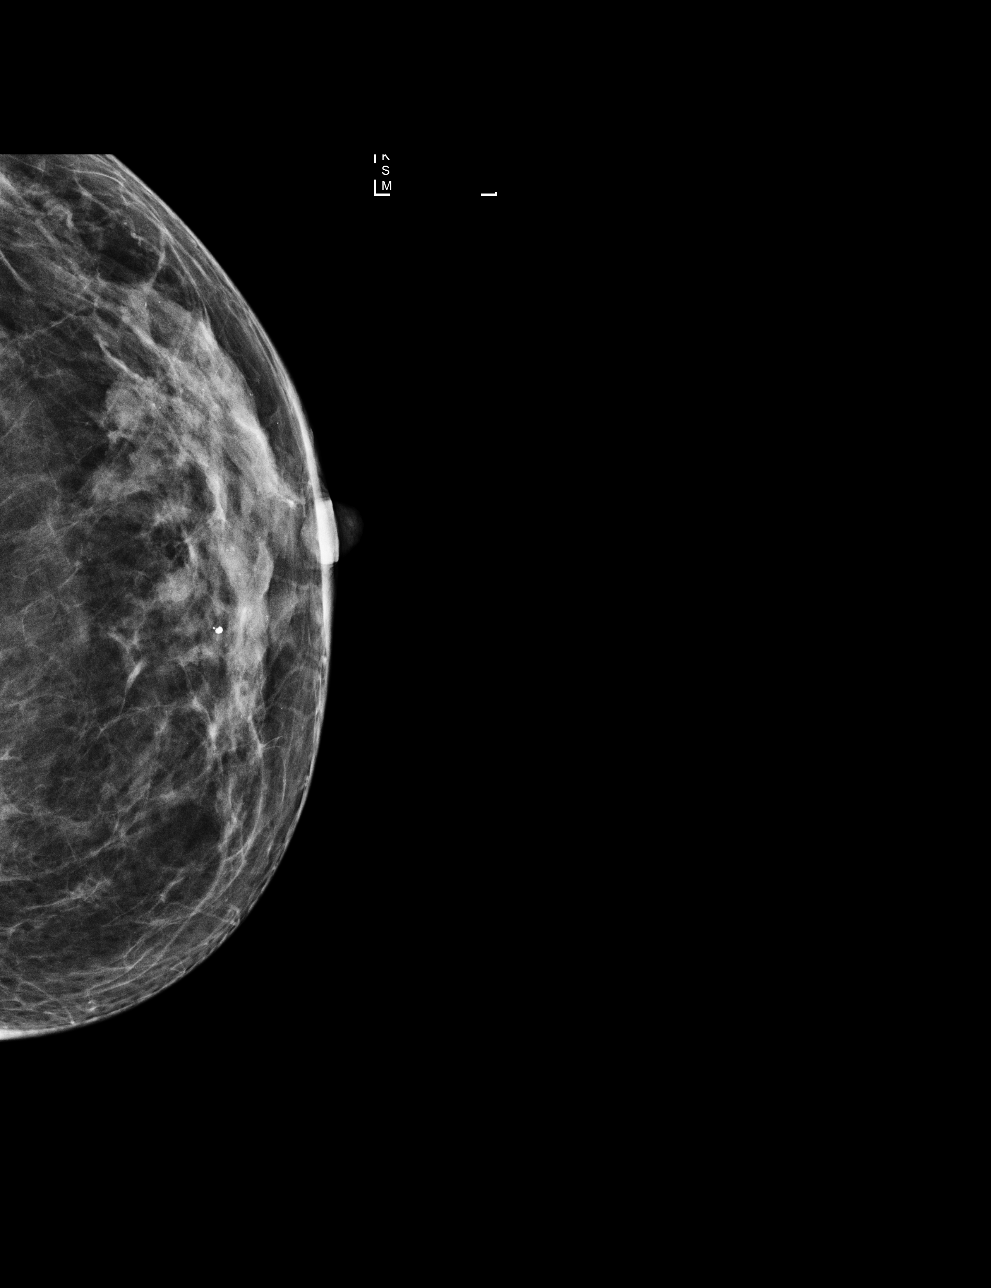

[L ML]
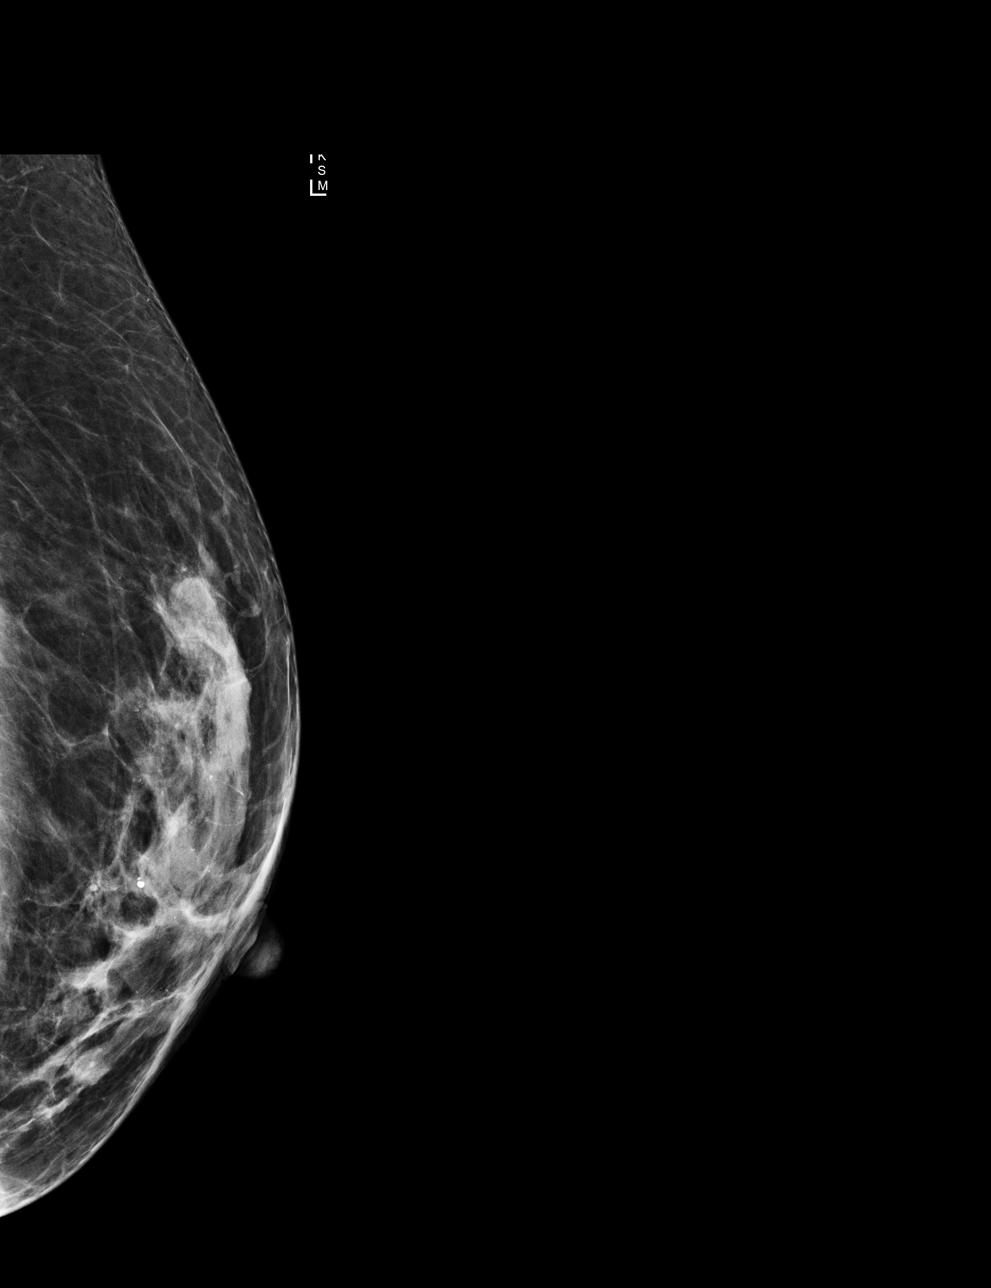

[L CC tomo · tomo slice 17/33.0]
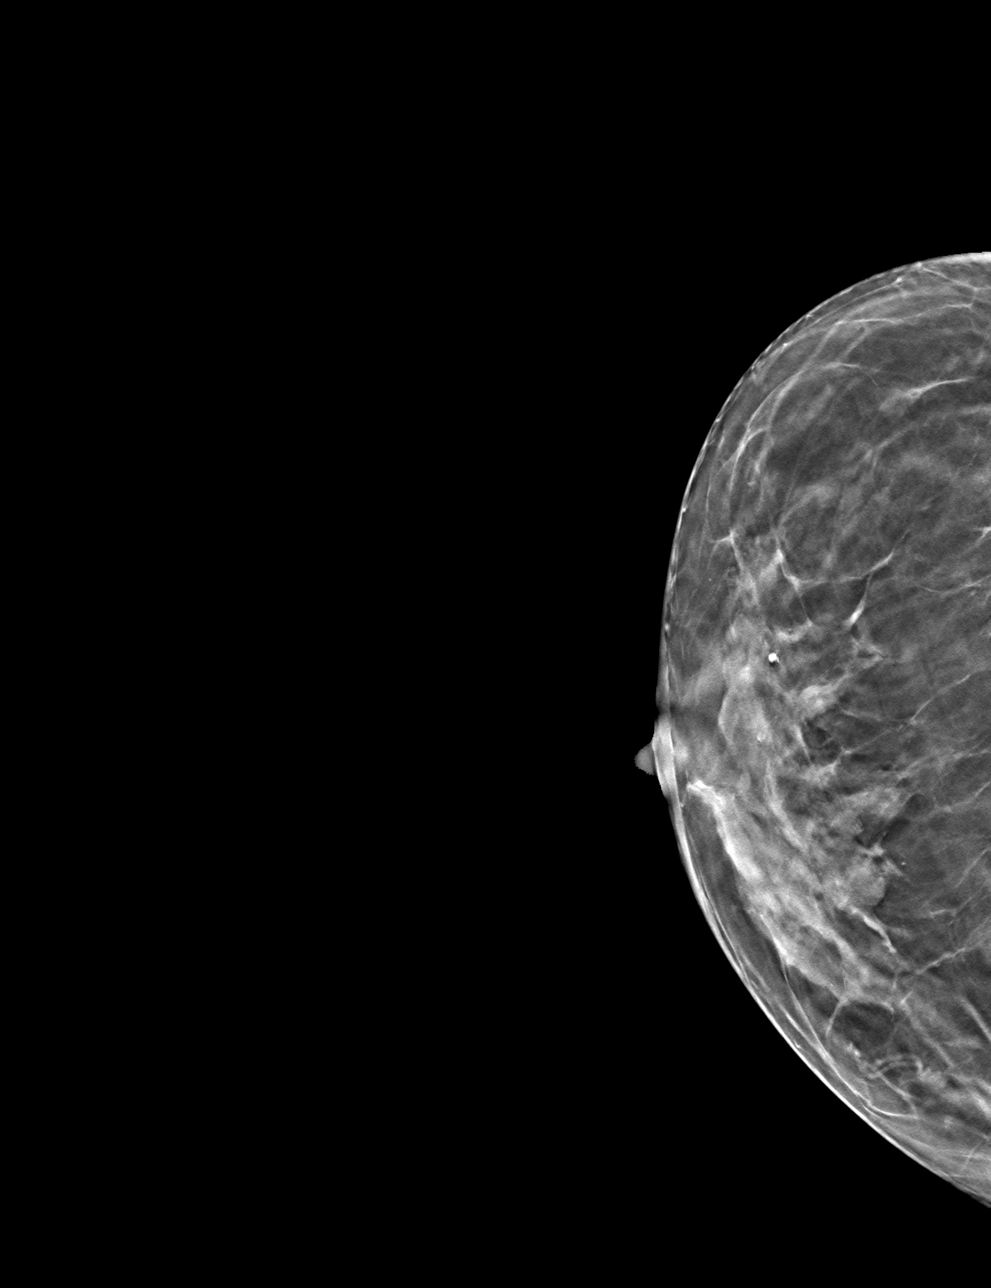

[L ML tomo · tomo slice 19/36.0]
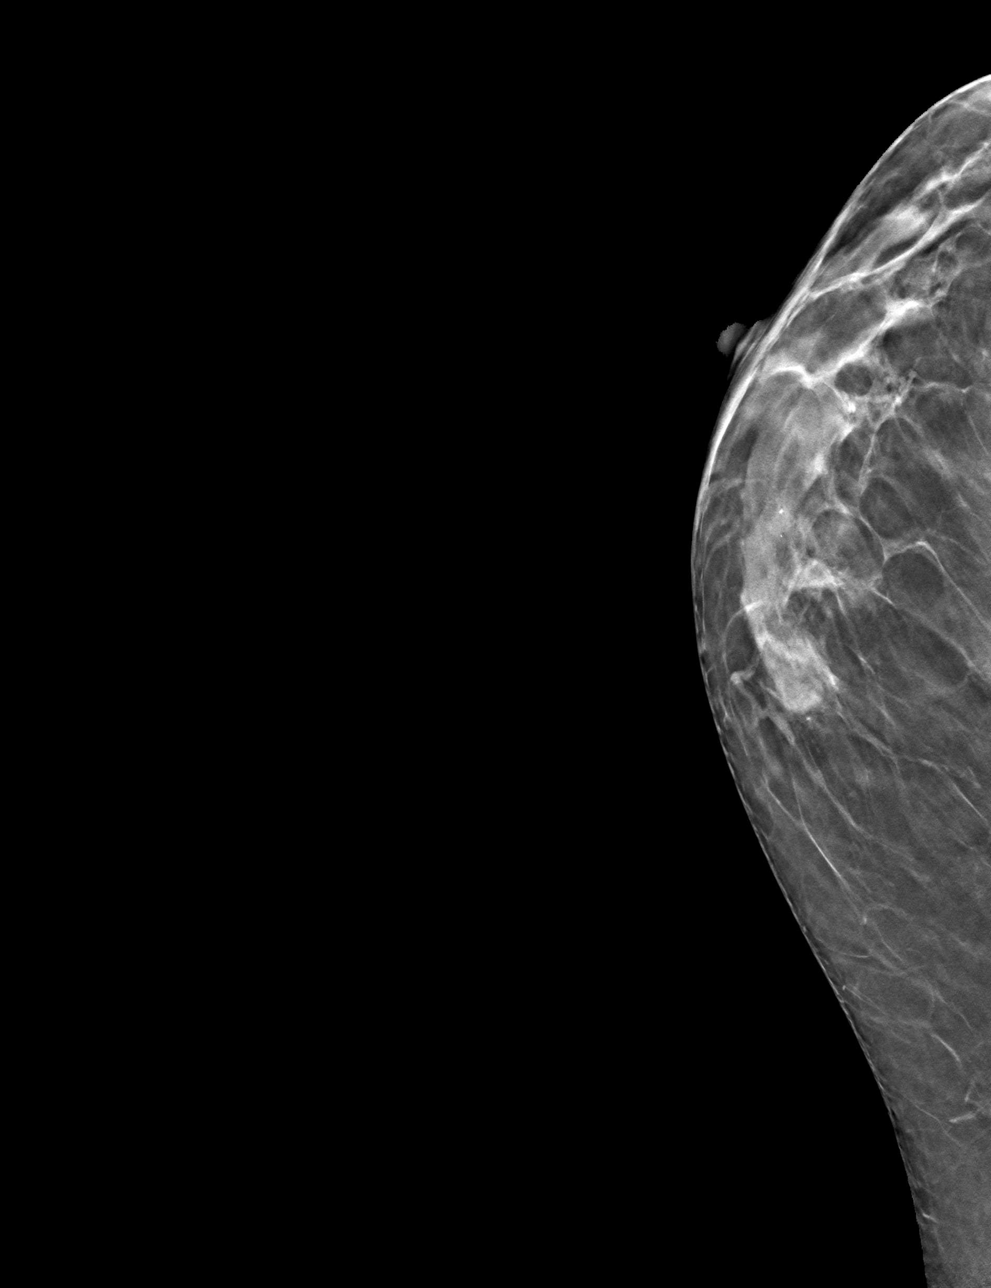

[4 of 12 positions shown; findings below may reference images not displayed]

ACR Breast Density Category c: The breast tissue is heterogeneously
dense, which may obscure small masses.
FINDINGS: Additional mammographic views of the left breast demonstrate no
suspicious masses, areas of architectural distortion or
microcalcifications.

Mammographic images were processed with CAD.

On physical exam, no suspicious masses are found.

Targeted ultrasound is performed, showing no suspicious masses or
shadowing lesions. Intact saline implant is noted.
IMPRESSION: No mammographic or sonographic evidence of malignancy in the left
breast.

RECOMMENDATION:
Screening mammogram in one year.(Code:50-A-28H)

I have discussed the findings and recommendations with the patient.
Results were also provided in writing at the conclusion of the
visit. If applicable, a reminder letter will be sent to the patient
regarding the next appointment.

BI-RADS CATEGORY  2: Benign.

## 2017-03-12 ENCOUNTER — Other Ambulatory Visit: Payer: Self-pay | Admitting: Internal Medicine

## 2017-03-12 DIAGNOSIS — Z1231 Encounter for screening mammogram for malignant neoplasm of breast: Secondary | ICD-10-CM

## 2017-03-13 ENCOUNTER — Ambulatory Visit
Admission: RE | Admit: 2017-03-13 | Discharge: 2017-03-13 | Disposition: A | Discharge status: Home / Self Care | Payer: 59 | Source: Ambulatory Visit | Attending: Internal Medicine | Admitting: Internal Medicine

## 2017-03-13 DIAGNOSIS — Z1231 Encounter for screening mammogram for malignant neoplasm of breast: Secondary | ICD-10-CM

## 2017-12-05 ENCOUNTER — Other Ambulatory Visit: Payer: Self-pay | Admitting: Gastroenterology

## 2017-12-05 DIAGNOSIS — Z8719 Personal history of other diseases of the digestive system: Secondary | ICD-10-CM

## 2017-12-13 ENCOUNTER — Other Ambulatory Visit: Payer: 59

## 2018-04-21 ENCOUNTER — Other Ambulatory Visit: Payer: Self-pay | Admitting: Internal Medicine

## 2019-02-07 ENCOUNTER — Encounter: Payer: Self-pay | Admitting: Internal Medicine

## 2019-02-07 DIAGNOSIS — G479 Sleep disorder, unspecified: Secondary | ICD-10-CM | POA: Insufficient documentation

## 2023-02-01 ENCOUNTER — Other Ambulatory Visit (HOSPITAL_COMMUNITY)
Admission: RE | Admit: 2023-02-01 | Discharge: 2023-02-01 | Disposition: A | Discharge status: Home / Self Care | Payer: Medicare PPO | Source: Ambulatory Visit | Attending: Family Medicine | Admitting: Family Medicine

## 2023-02-01 ENCOUNTER — Other Ambulatory Visit: Payer: Self-pay | Admitting: Family Medicine

## 2023-02-01 DIAGNOSIS — Z1231 Encounter for screening mammogram for malignant neoplasm of breast: Secondary | ICD-10-CM

## 2023-02-01 DIAGNOSIS — Z1151 Encounter for screening for human papillomavirus (HPV): Secondary | ICD-10-CM | POA: Diagnosis not present

## 2023-02-01 DIAGNOSIS — Z01411 Encounter for gynecological examination (general) (routine) with abnormal findings: Secondary | ICD-10-CM | POA: Insufficient documentation

## 2023-02-04 LAB — CYTOLOGY - PAP
Comment: NEGATIVE
Diagnosis: NEGATIVE
High risk HPV: NEGATIVE

## 2023-02-06 ENCOUNTER — Other Ambulatory Visit: Payer: Self-pay | Admitting: Family Medicine

## 2023-02-06 DIAGNOSIS — M810 Age-related osteoporosis without current pathological fracture: Secondary | ICD-10-CM

## 2023-02-06 DIAGNOSIS — M8580 Other specified disorders of bone density and structure, unspecified site: Secondary | ICD-10-CM

## 2023-02-07 ENCOUNTER — Ambulatory Visit
Admission: RE | Admit: 2023-02-07 | Discharge: 2023-02-07 | Disposition: A | Discharge status: Home / Self Care | Payer: Medicare PPO | Source: Ambulatory Visit | Attending: Family Medicine | Admitting: Family Medicine

## 2023-02-07 ENCOUNTER — Other Ambulatory Visit: Payer: Self-pay | Admitting: Family Medicine

## 2023-02-07 DIAGNOSIS — Z1231 Encounter for screening mammogram for malignant neoplasm of breast: Secondary | ICD-10-CM

## 2023-02-27 ENCOUNTER — Ambulatory Visit: Payer: Self-pay

## 2023-03-01 ENCOUNTER — Ambulatory Visit (INDEPENDENT_AMBULATORY_CARE_PROVIDER_SITE_OTHER): Payer: Medicare HMO | Admitting: Podiatry

## 2023-03-01 ENCOUNTER — Encounter: Payer: Self-pay | Admitting: Podiatry

## 2023-03-01 ENCOUNTER — Ambulatory Visit (INDEPENDENT_AMBULATORY_CARE_PROVIDER_SITE_OTHER): Payer: Medicare HMO

## 2023-03-01 DIAGNOSIS — M79672 Pain in left foot: Secondary | ICD-10-CM

## 2023-03-01 DIAGNOSIS — M722 Plantar fascial fibromatosis: Secondary | ICD-10-CM

## 2023-03-01 MED ORDER — TRIAMCINOLONE ACETONIDE 10 MG/ML IJ SUSP
10.0000 mg | Freq: Once | INTRAMUSCULAR | Status: AC
  Administered 2023-03-01: 10 mg via INTRA_ARTICULAR

## 2023-03-04 NOTE — Progress Notes (Signed)
 Subjective:   Patient ID: Lindsey Owens, female   DOB: 68 y.o.   MRN: [id number]   HPI Patient presents stating that she is getting pain in her left arch and it has been quite sore with a nodule formation that is occurred over the last 6 months with history of Dupuytren's contracture and having to have surgery.  Patient does not smoke likes to be active   Review of Systems  All other systems reviewed and are negative.       Objective:  Physical Exam Vitals and nursing note reviewed.  Constitutional:      Appearance: She is well-developed.  Pulmonary:     Effort: Pulmonary effort is normal.  Musculoskeletal:        General: Normal range of motion.  Skin:    General: Skin is warm.  Neurological:     Mental Status: She is alert.     Neurovascular status intact muscle strength found to be adequate with inflammation mid arch area left with enlargement of the medial fascia that approximately is 1 cm x 1 cm.  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

## 2023-03-22 ENCOUNTER — Telehealth: Payer: Self-pay | Admitting: Podiatry

## 2023-03-22 ENCOUNTER — Ambulatory Visit (INDEPENDENT_AMBULATORY_CARE_PROVIDER_SITE_OTHER): Payer: Medicare HMO | Admitting: Podiatry

## 2023-03-22 ENCOUNTER — Telehealth: Payer: Self-pay | Admitting: Urology

## 2023-03-22 ENCOUNTER — Encounter: Payer: Self-pay | Admitting: Podiatry

## 2023-03-22 DIAGNOSIS — M722 Plantar fascial fibromatosis: Secondary | ICD-10-CM

## 2023-03-22 NOTE — Telephone Encounter (Signed)
DOS-03/26/23  PLANTAR FIBROMA [id number]  [employer] EFFECTIVE DATE- 02/20/23 DEDUCTIBLE- $0.00 WITH REMAINING $0.00 OOP-$5900.00 WITH REMAINING $5,869.80  COINSURANCE- 0%  PER THE AUTOMATED [employer] SYSTEM, PRIOR AUTH IS NOT REQUIRED FOR CPT CODE 09604.  CALL REF #: [id number]

## 2023-03-22 NOTE — Telephone Encounter (Signed)
LM for pt to call back when she is ready to schedule her sx with Dr. Charlsie Merles.

## 2023-03-22 NOTE — Progress Notes (Signed)
Subjective:   Patient ID: Lindsey Owens, female   DOB: 68 y.o.   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  I did explain procedure risk and patient wants surgery and understands all complications and alternative treatments as outlined in the consent form.  Patient signed consent form after review understands total recovery takes around 6 months air fracture walker dispensed today properly fitted to her lower leg and I want her to get used to it prior to surgery and I encouraged this patient to wear now to try to shrink it.  Patient wants surgery all questions answered scheduled outpatient procedure

## 2023-03-25 MED ORDER — HYDROCODONE-ACETAMINOPHEN 10-325 MG PO TABS: 1.0000 | ORAL_TABLET | Freq: Three times a day (TID) | ORAL | 0 refills | Status: AC | PRN

## 2023-03-25 NOTE — Addendum Note (Signed)
Addended by: Lenn Sink on: 03/25/2023 02:59 PM   Modules accepted: Orders

## 2023-03-26 DIAGNOSIS — D2122 Benign neoplasm of connective and other soft tissue of left lower limb, including hip: Secondary | ICD-10-CM | POA: Diagnosis not present

## 2023-03-26 DIAGNOSIS — D492 Neoplasm of unspecified behavior of bone, soft tissue, and skin: Secondary | ICD-10-CM | POA: Diagnosis not present

## 2023-03-26 DIAGNOSIS — M722 Plantar fascial fibromatosis: Secondary | ICD-10-CM

## 2023-03-28 DIAGNOSIS — G4700 Insomnia, unspecified: Secondary | ICD-10-CM | POA: Diagnosis not present

## 2023-04-01 ENCOUNTER — Encounter: Payer: Self-pay | Admitting: Podiatry

## 2023-04-01 ENCOUNTER — Ambulatory Visit (INDEPENDENT_AMBULATORY_CARE_PROVIDER_SITE_OTHER): Payer: Medicare HMO | Admitting: Podiatry

## 2023-04-01 ENCOUNTER — Ambulatory Visit (INDEPENDENT_AMBULATORY_CARE_PROVIDER_SITE_OTHER): Payer: Medicare HMO

## 2023-04-01 DIAGNOSIS — M722 Plantar fascial fibromatosis: Secondary | ICD-10-CM

## 2023-04-03 NOTE — Progress Notes (Signed)
Subjective:   Patient ID: Lindsey Owens, female   DOB: 68 y.o.   MRN: [id number]   HPI Patient presents stating doing very well with surgery very pleased minimal discomfort near   ROS      Objective:  Physical Exam  Vascular status intact negative [name]' sign noted wound edges plantar left healing well stitches intact and mild edema consistent with postop     Assessment:  Doing well post fibroma excision left     Plan:  Sterile dressing reapplied continue elevation compression boot usage did dispense surgical shoe just to wear for short periods reappoint 2 weeks stitch removal earlier if needed  Precautionary x-rays negative for signs of any calcifications negative for signs of any other pathology associated with surgery

## 2023-04-15 ENCOUNTER — Encounter: Payer: Self-pay | Admitting: Podiatry

## 2023-04-15 ENCOUNTER — Ambulatory Visit (INDEPENDENT_AMBULATORY_CARE_PROVIDER_SITE_OTHER): Payer: Medicare HMO | Admitting: Podiatry

## 2023-04-15 DIAGNOSIS — M722 Plantar fascial fibromatosis: Secondary | ICD-10-CM

## 2023-04-15 NOTE — Progress Notes (Signed)
 Subjective:   Patient ID: Lindsey Owens, female   DOB: 68 y.o.   MRN: [id number]   HPI Patient presents with well-healed surgical site plantar left states she had minimal discomfort   ROS      Objective:  Physical Exam  Neuro vas scaler status intact negative Lindsey Owens' sign noted wound edges coapted well stitches intact     Assessment:  Doing well post plantar fibroma excision left     Plan:  H&P reviewed stitches removed wound edges coapted well dispense ankle compression stocking resume shoe gear in the next week or 2 reappoint to check as needed

## 2023-06-20 DIAGNOSIS — G4700 Insomnia, unspecified: Secondary | ICD-10-CM | POA: Diagnosis not present

## 2023-06-20 DIAGNOSIS — F419 Anxiety disorder, unspecified: Secondary | ICD-10-CM | POA: Diagnosis not present

## 2023-08-01 DIAGNOSIS — G4700 Insomnia, unspecified: Secondary | ICD-10-CM | POA: Diagnosis not present

## 2023-08-01 DIAGNOSIS — F419 Anxiety disorder, unspecified: Secondary | ICD-10-CM | POA: Diagnosis not present

## 2023-10-01 ENCOUNTER — Other Ambulatory Visit: Payer: Self-pay

## 2023-10-14 DIAGNOSIS — F5101 Primary insomnia: Secondary | ICD-10-CM | POA: Diagnosis not present

## 2023-10-14 DIAGNOSIS — R5383 Other fatigue: Secondary | ICD-10-CM | POA: Diagnosis not present

## 2023-11-18 DIAGNOSIS — G4700 Insomnia, unspecified: Secondary | ICD-10-CM | POA: Diagnosis not present

## 2023-11-18 DIAGNOSIS — F419 Anxiety disorder, unspecified: Secondary | ICD-10-CM | POA: Diagnosis not present

## 2023-12-10 LAB — AMB RESULTS CONSOLE CBG: Glucose: 105

## 2023-12-10 NOTE — Progress Notes (Signed)
 Pt screened for HTN, BS. Encouraged to follow up with PCP. Denied SDOH needs

## 2023-12-31 DIAGNOSIS — Z0100 Encounter for examination of eyes and vision without abnormal findings: Secondary | ICD-10-CM | POA: Diagnosis not present

## 2023-12-31 DIAGNOSIS — H524 Presbyopia: Secondary | ICD-10-CM | POA: Diagnosis not present

## 2024-01-03 ENCOUNTER — Other Ambulatory Visit: Payer: Self-pay | Admitting: Internal Medicine

## 2024-01-03 DIAGNOSIS — Z1231 Encounter for screening mammogram for malignant neoplasm of breast: Secondary | ICD-10-CM

## 2024-01-28 NOTE — Progress Notes (Signed)
 Pt attended 12/10/2023 screening event with BP of 109/70.   Per chart review pt does have a PCP Lindsey Owens; [facility] [place]) , insurance, and is not a smoker. Pt's last appt with PCP was 04/30/2023 and was seen by different in office provider on 10/14/2023. Pt does not indicate any SDOH needs at this time.  No additional pt f/u to be scheduled at this time per health equity protocol.

## 2024-02-03 DIAGNOSIS — F5101 Primary insomnia: Secondary | ICD-10-CM | POA: Diagnosis not present

## 2024-02-03 DIAGNOSIS — N951 Menopausal and female climacteric states: Secondary | ICD-10-CM | POA: Diagnosis not present

## 2024-02-03 DIAGNOSIS — Z780 Asymptomatic menopausal state: Secondary | ICD-10-CM | POA: Diagnosis not present

## 2024-02-03 DIAGNOSIS — Z0000 Encounter for general adult medical examination without abnormal findings: Secondary | ICD-10-CM | POA: Diagnosis not present

## 2024-02-03 DIAGNOSIS — R5383 Other fatigue: Secondary | ICD-10-CM | POA: Diagnosis not present

## 2024-02-10 ENCOUNTER — Ambulatory Visit

## 2024-02-10 DIAGNOSIS — Z1231 Encounter for screening mammogram for malignant neoplasm of breast: Secondary | ICD-10-CM
